# Patient Record
Sex: Male | Born: 1960 | Race: White | Hispanic: No
Health system: Midwestern US, Academic
[De-identification: ages and names within clinical notes are randomized; demographics above are authoritative.]

## PROBLEM LIST (undated history)

## (undated) DIAGNOSIS — F32A Depression, unspecified: Secondary | ICD-10-CM

## (undated) DIAGNOSIS — F419 Anxiety disorder, unspecified: Secondary | ICD-10-CM

## (undated) DIAGNOSIS — F329 Major depressive disorder, single episode, unspecified: Secondary | ICD-10-CM

## (undated) DIAGNOSIS — J449 Chronic obstructive pulmonary disease, unspecified: Secondary | ICD-10-CM

## (undated) DIAGNOSIS — M1990 Unspecified osteoarthritis, unspecified site: Secondary | ICD-10-CM

## (undated) DIAGNOSIS — R569 Unspecified convulsions: Secondary | ICD-10-CM

## (undated) HISTORY — PX: CARDIAC CATHETERIZATION: SHX172

---

## 2012-06-17 ENCOUNTER — Inpatient Hospital Stay
Admit: 2012-06-17 | Discharge: 2012-06-18 | Disposition: A | Discharge status: Home / Self Care | Payer: PRIVATE HEALTH INSURANCE

## 2012-06-17 DIAGNOSIS — R1011 Right upper quadrant pain: Secondary | ICD-10-CM

## 2012-06-17 LAB — CBC
Hematocrit: 42 % (ref 38.5–50.0)
Hemoglobin: 14.1 g/dL (ref 13.2–17.1)
MCH: 30.8 pg (ref 27.0–33.0)
MCHC: 33.6 g/dL (ref 32.0–36.0)
MCV: 91.6 fL (ref 80.0–100.0)
MPV: 8.6 fL (ref 7.5–11.5)
Platelets: 214 10*3/uL (ref 140–400)
RBC: 4.58 10*6/uL (ref 4.20–5.80)
RDW: 13.2 % (ref 11.0–15.0)
WBC: 5.5 10*3/uL (ref 3.8–10.8)

## 2012-06-17 LAB — URINALYSIS W/RFL TO MICROSCOPIC
Bilirubin, UA: NEGATIVE
Blood, UA: NEGATIVE
Glucose, UA: NEGATIVE mg/dL
Ketones, UA: NEGATIVE mg/dL
Leukocytes, UA: NEGATIVE
Nitrite, UA: NEGATIVE
Protein, UA: NEGATIVE mg/dL
Specific Gravity, UA: 1.017 (ref 1.005–1.035)
Urobilinogen, UA: <2 mg/dL (ref 0.2–1.9)
pH, UA: 6 (ref 5.0–8.0)

## 2012-06-17 LAB — HEPATIC FUNCTION PANEL
ALT: 35 U/L (ref 13–69)
AST: 27 U/L (ref 14–65)
Albumin: 4.5 g/dL (ref 3.6–5.1)
Alkaline Phosphatase: 70 U/L (ref 40–115)
Bilirubin, Direct: 0 mg/dL (ref 0.0–0.2)
Total Bilirubin: 0.7 mg/dL (ref 0.2–1.2)
Total Protein: 7.2 g/dL (ref 6.2–8.3)

## 2012-06-17 LAB — DIFFERENTIAL
Basophils Absolute: 28 /uL (ref 0–200)
Basophils Relative: 0.5 % (ref 0.0–1.0)
Eosinophils Absolute: 88 /uL (ref 15–500)
Eosinophils Relative: 1.6 % (ref 0.0–8.0)
Lymphocytes Absolute: 1870 /uL (ref 850–3900)
Lymphocytes Relative: 34 % (ref 15.0–45.0)
Monocytes Absolute: 473 /uL (ref 200–950)
Monocytes Relative: 8.6 % (ref 0.0–12.0)
Neutrophils Absolute: 3042 /uL (ref 1500–7800)
Neutrophils Relative: 55.3 % (ref 40.0–80.0)

## 2012-06-17 LAB — BASIC METABOLIC PANEL
Anion Gap: 11 mmol/L (ref 3–16)
BUN: 18 mg/dL (ref 7–25)
CO2: 27 mmol/L (ref 21–33)
Calcium: 9.3 mg/dL (ref 8.6–10.2)
Chloride: 103 mmol/L (ref 98–110)
Creatinine: 0.94 mg/dL (ref 0.50–1.30)
GFR MDRD Af Amer: 102 See note.
GFR MDRD Non Af Amer: 85 See note.
Glucose: 90 mg/dL (ref 65–99)
Osmolality, Calculated: 293 mOsm/kg (ref 278–305)
Potassium: 3.9 mmol/L (ref 3.5–5.3)
Sodium: 141 mmol/L (ref 135–146)

## 2012-06-17 LAB — LIPASE: Lipase: 150 U/L (ref 23–300)

## 2012-06-17 NOTE — Unmapped (Signed)
[facility] ED Note    Date of Service:  06/17/2012    Reason for Visit: Abdominal Pain      Patient History     HPI: Jimmy Patterson is a 52 y.o. male who presents with intermittent abdominal pain.  The patient describes his pain as being in the right upper quadrant.  It began 24 hours ago.  He initially thought it was gas.  It is primarily in the right upper quadrant.  What was going on he did feel some relief by squeezing around the right rib cage.  He describes it as shooting pains and electrical somewhat like a stitch in the side.  It comes and goes and lasts for one to 2 hours at a time.  He denies any shortness of breath or chest pain or cough.  He's had no lower extremity edema.  He denies any history of recent surgeries recent trauma or history of blood clot of any sort.  He denies any nausea or vomiting.  He has had a good appetite.  By mouth intake is not influenced the pain whatsoever.  He denies any urinary symptoms.  He has never had similar to this in the past.  He does not currently have any pain whatsoever.    History reviewed. No pertinent past medical history.    History reviewed. No pertinent past surgical history.     reports that he has quit smoking. He does not have any smokeless tobacco history on file. He reports that he drinks about 1.8 ounces of alcohol per week. He reports that he does not use illicit drugs.    Previous Medications    No medications on file       Allergies:   Allergies as of 06/17/2012   ??? (No Known Allergies)       Review of Systems      A ten point review of systems was performed and found to be negative with the exception of those things mentioned in the history of present illness.    Physical Exam     ED Triage Vitals   Vital Signs Group      Temp 06/17/12 1700 98.4 ??F (36.9 ??C)      Temp Source 06/17/12 1700 Oral      Heart Rate 06/17/12 1700 58      Heart Rate Source 06/17/12 1700 Monitor      Resp 06/17/12 1700 18      BP 06/17/12 1700 164/106 mmHg      BP Location 06/17/12 1700 Right arm      BP Method 06/17/12 1700 Automatic      Patient Position 06/17/12 1700 Lying   SpO2 06/17/12 1700 58 %   O2 Device 06/17/12 1700 None (Room air)       General: Well appearing.  No acute distress.  HEENT:  Normocephalic, atraumatic.  Moist mucus membranes.  Oropharynx is clear, uvula midline, tonsils are unremarkable.  Neck:  Trachea midline, no JVD, no meningismus   Eyes:  PERRL, EOMI, anicteric sclera  Psych:  Normal affect  MSK:  No long bone deformities, no edema  Pulm:  Clear to auscultation bilaterally.  No wheezes, rales, or rhonchi.  Cardiac:  Regular rate and rhythm; no murmurs, rubs, or gallops. 2+pulses in the upper extremities.  Abdomen: soft, non-tender, no rebound, no guarding, negative Murphy's sign  Neuro:  Awake, alert, and oriented x 3.  Moving all extremities.  GCS 15.    Diagnostic Studies  All laboratory and radiologic examinations performed in the emergency department were reviewed.  Important findings are discussed below.    Labs:   Please see electronic medical record for any tests performed in the ED    Radiology:    Please see electronic medical record for any tests performed in the ED          Emergency Department Procedures         Consultations     None    ED Course and MDM     Diagnosis:   1) abdominal pain    Patient is very well-appearing.  I'm unable to reproduce in his pain whatsoever he states he has no pain at this time.  His vital signs are unremarkable with the exception of mild hypertension and an oxygen saturation documented at 58 which I suspect be an error given the patient has absolutely no respiratory symptoms whatsoever.  Repeat oxygen saturation will be obtained.  Estimated as normal the patient is a PE or see negative.  I do not feel that his right upper quadrant abdominal tenderness represents a pulmonary embolism.  He has no blood in his urine as such I do not think that imaging for stone  he is reasonable.  He has negative Murphy sign no tenderness at this time as such I do not think that imaging for gallbladder would be appropriate or productive.  Gastritis versus duodenitis is certainly a possibility and the patient will be advised treatment as such was given strict precautions and discharged to followup with his primary care physician as needed.      Critical Care Time (Attendings)       Kristine Garbe, MD  06/17/12 2030

## 2012-06-17 NOTE — Unmapped (Signed)
Irritation to the lining of your stomach or small intestine or possible causes of your discomfort.  You have the pain attempt treatment with a medicine like Maalox or Mylanta to see how it helps.  You may also consider beginning a proton pump inhibitor such as Prilosec or Prevacid over-the-counter generic versions are acceptable and equal.  Other considerations for causes of her pain could be an early small kidney stone but we see no evidence of this.  Gallbladder problems are not impossible began unlikely given your examination.  If you develop any new or concerning symptoms please return for reevaluation.    Abdominal Pain  Abdominal pain can be caused by many things. Your caregiver decides the seriousness of your pain by an examination and possibly blood tests and X-rays. Many cases can be observed and treated at home. Most abdominal pain is not caused by a disease and will probably improve without treatment. However, in many cases, more time must pass before a clear cause of the pain can be found. Before that point, it may not be known if you need more testing, or if hospitalization or surgery is needed.  HOME CARE INSTRUCTIONS   ?? Do not take laxatives unless directed by your caregiver.  ?? Take pain medicine only as directed by your caregiver.  ?? Only take over-the-counter or prescription medicines for pain, discomfort, or fever as directed by your caregiver.  ?? Try a clear liquid diet (broth, tea, or water) for as long as directed by your caregiver. Slowly move to a bland diet as tolerated.  SEEK IMMEDIATE MEDICAL CARE IF:   ?? The pain does not go away.  ?? You have a fever.  ?? You keep throwing up (vomiting).  ?? The pain is felt only in portions of the abdomen. Pain in the right side could possibly be appendicitis. In an adult, pain in the left lower portion of the abdomen could be colitis or diverticulitis.  ?? You pass bloody or black tarry stools.  MAKE SURE YOU:   ?? Understand these instructions.  ?? Will  watch your condition.  ?? Will get help right away if you are not doing well or get worse.  Document Released: 10/08/2004 Document Revised: 03/23/2011 Document Reviewed: 08/17/2007  ExitCare?? Patient Information ??2014 [place], [place].

## 2013-01-23 DIAGNOSIS — F4010 Social phobia, unspecified: Secondary | ICD-10-CM | POA: Insufficient documentation

## 2014-03-05 DIAGNOSIS — G8929 Other chronic pain: Secondary | ICD-10-CM

## 2015-06-10 DIAGNOSIS — D7589 Other specified diseases of blood and blood-forming organs: Secondary | ICD-10-CM | POA: Insufficient documentation

## 2015-06-10 DIAGNOSIS — D696 Thrombocytopenia, unspecified: Secondary | ICD-10-CM | POA: Insufficient documentation

## 2015-06-10 DIAGNOSIS — R569 Unspecified convulsions: Secondary | ICD-10-CM | POA: Insufficient documentation

## 2015-06-10 DIAGNOSIS — F1090 Alcohol use, unspecified, uncomplicated: Secondary | ICD-10-CM

## 2015-06-10 DIAGNOSIS — F17200 Nicotine dependence, unspecified, uncomplicated: Secondary | ICD-10-CM

## 2015-06-10 DIAGNOSIS — R748 Abnormal levels of other serum enzymes: Secondary | ICD-10-CM | POA: Insufficient documentation

## 2015-06-10 DIAGNOSIS — F419 Anxiety disorder, unspecified: Secondary | ICD-10-CM | POA: Insufficient documentation

## 2015-06-11 DIAGNOSIS — J431 Panlobular emphysema: Secondary | ICD-10-CM | POA: Insufficient documentation

## 2015-06-11 DIAGNOSIS — E538 Deficiency of other specified B group vitamins: Secondary | ICD-10-CM | POA: Insufficient documentation

## 2015-06-12 DIAGNOSIS — K7030 Alcoholic cirrhosis of liver without ascites: Secondary | ICD-10-CM | POA: Insufficient documentation

## 2015-07-08 DIAGNOSIS — F411 Generalized anxiety disorder: Secondary | ICD-10-CM | POA: Insufficient documentation

## 2015-07-08 DIAGNOSIS — S069XAA Unspecified intracranial injury with loss of consciousness status unknown, initial encounter: Secondary | ICD-10-CM | POA: Insufficient documentation

## 2015-07-08 DIAGNOSIS — G2581 Restless legs syndrome: Secondary | ICD-10-CM | POA: Insufficient documentation

## 2015-07-08 DIAGNOSIS — M5030 Other cervical disc degeneration, unspecified cervical region: Secondary | ICD-10-CM | POA: Insufficient documentation

## 2015-07-08 DIAGNOSIS — G4733 Obstructive sleep apnea (adult) (pediatric): Secondary | ICD-10-CM

## 2015-07-08 DIAGNOSIS — J449 Chronic obstructive pulmonary disease, unspecified: Secondary | ICD-10-CM

## 2015-11-19 ENCOUNTER — Inpatient Hospital Stay
Admit: 2015-11-19 | Discharge: 2015-11-19 | Disposition: A | Discharge status: Home / Self Care | Payer: PRIVATE HEALTH INSURANCE

## 2015-11-19 DIAGNOSIS — S61212A Laceration without foreign body of right middle finger without damage to nail, initial encounter: Secondary | ICD-10-CM

## 2015-11-19 MED ORDER — sodium chloride, irrigation 0.9 % irrigation
0.9 | Status: AC
Start: 2015-11-19 — End: 2015-11-19
  Administered 2015-11-19: 22:00:00

## 2015-11-19 MED ORDER — bacitracin-polymyxin b (POLYSPORIN) ointment (packets)
500-10000 | Freq: Once | TOPICAL | Status: AC
Start: 2015-11-19 — End: 2015-11-19
  Administered 2015-11-19: 21:00:00 1 via TOPICAL

## 2015-11-19 MED ORDER — bacitracin zinc ointment
500 | Freq: Two times a day (BID) | TOPICAL | 0 refills | Status: AC
Start: 2015-11-19 — End: ?

## 2015-11-19 MED ORDER — diphth,pertus(acell),tetanus (BOOSTRIX TDAP) 2.5-8-5 Lf-mcg-Lf/0.5mL syringe Syrg 0.5 mL
2.5-8-5 | Freq: Once | INTRAMUSCULAR | Status: AC
Start: 2015-11-19 — End: 2015-11-19
  Administered 2015-11-19: 21:00:00 0.5 mL via INTRAMUSCULAR

## 2015-11-19 MED ORDER — sodium chloride, irrigation 0.9 % irrigation
0.9 | Status: AC
Start: 2015-11-19 — End: 2015-11-19
  Administered 2015-11-19: 21:00:00 500

## 2015-11-19 MED FILL — SODIUM CHLORIDE 0.9 % IRRIGATION SOLUTION: 0.9 0.9 % | Qty: 500

## 2015-11-19 MED FILL — BOOSTRIX TDAP 2.5 LF UNIT-8 MCG-5 LF/0.5 ML INTRAMUSCULAR SYRINGE: 2.5-8-5 2.5-8-5 Lf-mcg-Lf/0.5mL | INTRAMUSCULAR | Qty: 0.5

## 2015-11-19 MED FILL — BACITRACIN-POLYMYXIN B 500 UNIT-10,000 UNIT/GRAM TOPICAL PACKET: 500-10000 500-10,000 unit/gram | TOPICAL | Qty: 1

## 2015-11-19 MED FILL — SODIUM CHLORIDE 0.9 % IRRIGATION SOLUTION: 0.9 0.9 % | Qty: 1000

## 2015-11-19 NOTE — ED Provider Notes (Signed)
[facility] ED Note    Reason for Visit: Laceration      Patient History     HPI:    Jimmy Patterson is a 55 y.o. male who presents to the ED with lacerations to the R hand. He states that 15 hours prior to arrival, he cut it on a piece of metal. He has lacerations to the 3rd, 4th, and 5th digits. He states that he has been able To move his fingers normally.  He has normal sensation to the fingers.  No fevers or chills.  No purulent drainage.  He states that he is unsure when his last tetanus shot is calm and states he is probably due for a new one.  After he sustained a laceration, he noted that there was a flap of skin, in particular on the 5th digit, so he went to an urgent care.  However they were closing, and he was unable to be seen at the time.  He then put over-the-counter glue as well as over-the-counter coagulant on his hand, to get the bleeding to stop.  At that point he was unable to get medical care because he was going to miss a flight from [facility].  He presents now 15 hours later from the injury.    He did not go to the ED when this happened because he needed to catch a flight home from [facility].      History reviewed. No pertinent past medical history.    Past Surgical History:   Procedure Laterality Date    JOINT REPLACEMENT  03/2015, 2015    BILATERAL HIP REPLACEMENTS       Jimmy Patterson  reports that he has quit smoking. He has never used smokeless tobacco. He reports that he drinks about 1.8 oz of alcohol per week . He reports that he does not use drugs.    Previous Medications    No medications on file       Allergies:   Allergies as of 11/19/2015    (No Known Allergies)       Review of Systems     ROS:    Ten point review of systems was performed. All were negative aside from those described in the HPI.     Physical Exam     ED Triage Vitals [11/19/15 1544]   Vital Signs Group      Temp 98.5 F (36.9 C)      Temp Source Oral      Heart  Rate 78      Heart Rate Source Monitor      Resp 18      SpO2 96 %      BP (!) 131/93      BP Location Left arm      BP Method Automatic      Patient Position Sitting   SpO2 96 %   O2 Device None (Room air)       General:   Well developed, well nourished. In no acute distress. He is very pleasant.   Neck:  Full range of motion. Supple.   Pulmonary:   Clear to auscultation bilaterally. No wheezes, rhonchi, or rales. No increased work of breathing.   Musculoskeletal:  No joint swelling, no joint erythema, no joint edema.  He has lacerations to the palmar surface of the third, fourth, 5th digits. There is a flap of skin cut on the 5th digit. There is no active bleeding. There is dark glue that is  present. There is also dark powder from the coagulation product that he used. On full range of movement, there is no visible tendon on the laceration. He has full strength in the fingers with flexion and extension, as well as sensation. He has mild swelling localized around the laceration. The 5th finger laceration has a small flap associated with it. There is no associated erythema or cellulitis. There is no draingae from the lacerations, and they are hemostatic.Please see electronic medical record for any tests performed in the ED    Vascular:  Strong distal pulses in all four extremities. Cap refill is < 2 seconds.   Skin:  No rashes. Warm and dry.   Neuro:  Alert and oriented X 4. Strength 5/5 in all four extremities. Gait and speech normal.   Psych:  Appropriate mood and affect.      Diagnostic Studies     Labs:    Please see electronic medical record for any tests performed in the ED     Radiology:    Please see electronic medical record for any tests performed in the ED        Emergency Department Procedures         ED Course and MDM     Jimmy Patterson is a 55 y.o. male who presented to the emergency department with Laceration  . VS and nursing notes were reviewed.     Patient presents to the emergency department with  hand lacerations.  There are lacerations on the 3rd 4th and 5th digits.  Given that there 15 hours old, I did not place sutures.  I explained to the patient that because it is 15 hours old, that he runs the risk of increased infection with sutures as opposed to allowing it to heal on its own.  We did soak his hand so we can get all of the coagulant off, as well as the glue.  Once this happened, he had bacitracin and Adaptic placed, as explained that this would need to heal by secondary intention.  His hand is neurovascularly intact, and I explained to him the signs and symptoms of an infection if he were to get one.  I did explain to him that this it is at risk of getting an infection because of the time that went by, although he did wash it out at home.  I instructed him to do dressing changes twice a day, both said that he is cleaning it twice a day and since it is healing by secondary intention, as well as so he can monitor it closely.  He was discharged in good condition, with bacitracin, Adaptic, and Kerlix.    Discharge information:    - Rinse out the wound twice a day  - Then, put bacitracin ointment on it, and then the adaptic. This will keep the kerlix (gauze) from sticking to your wound  - You are at risk of this getting infected. If it starts to get more red, more painful, more swollen, pus is coming out of it, or if you are having fevers / trouble moving your fingers, come back to the emergency department.     Clinical Impression:    Finger lacerations        Kerrie Pleasure, MD  11/19/15 [phone]

## 2015-11-19 NOTE — ED Notes (Signed)
MD at bedside.

## 2015-11-19 NOTE — ED Notes (Signed)
Pt lacerations cleansed after soaking in normal saline. Brown and black areas removed.   Polysporin, non adherent dressing and kerlix applied to pt's 2nd and 3rd fingers. Polysporin and kerlix applied to pt's 4th finger. Tol well.

## 2015-11-19 NOTE — ED Notes (Signed)
Pt wounds noted to have brown and black areas that are rigid and raised.  Pt states that he used a clotting spray that he bought at [facility] last night and he believes that the dark areas are from that spray.   Pt wounds cleansed and irrigated with NS.   Brown area remains on pt's 3rd  Finger.   Dr Thelma Barge notified.   Pt to soak hand for in normal saline and will try to clean dark areas again.

## 2015-11-19 NOTE — ED Notes (Signed)
Pt discharged to home. Pt verbalized understanding of discharge instructions. All questions answered. Ambulated from room with steady gait.

## 2015-11-19 NOTE — ED Triage Notes (Signed)
Pt arrives to ED with c/o lacerations to 3rd, 4th, and 5th fingers on right hand that happened last night. Pt repots he was holding onto a piece of metal and it sliced fingers. Bleeding controlled at this time. Distal CSM intact.

## 2015-11-19 NOTE — Unmapped (Signed)
-   Rinse out the wound twice a day  - Then, put bacitracin ointment on it, and then the adaptic. This will keep the kerlix (gauze) from sticking to your wound  - You are at risk of this getting infected. If it starts to get more red, more painful, more swollen, pus is coming out of it, or if you are having fevers / trouble moving your fingers, come back to the emergency department.

## 2017-07-29 DIAGNOSIS — G2581 Restless legs syndrome: Secondary | ICD-10-CM

## 2017-07-29 DIAGNOSIS — J9601 Acute respiratory failure with hypoxia: Secondary | ICD-10-CM

## 2017-07-29 DIAGNOSIS — R740 Nonspecific elevation of levels of transaminase and lactic acid dehydrogenase [LDH]: Secondary | ICD-10-CM

## 2017-07-29 DIAGNOSIS — S2249XA Multiple fractures of ribs, unspecified side, initial encounter for closed fracture: Secondary | ICD-10-CM

## 2017-07-29 DIAGNOSIS — K760 Fatty (change of) liver, not elsewhere classified: Secondary | ICD-10-CM | POA: Diagnosis not present

## 2017-07-29 DIAGNOSIS — S301XXA Contusion of abdominal wall, initial encounter: Secondary | ICD-10-CM

## 2017-07-29 DIAGNOSIS — S20219A Contusion of unspecified front wall of thorax, initial encounter: Secondary | ICD-10-CM

## 2017-07-30 ENCOUNTER — Inpatient Hospital Stay (HOSPITAL_COMMUNITY)
Admission: AD | Admit: 2017-07-30 | Discharge: 2017-08-09 | DRG: 183 | Disposition: A | Discharge status: Home Health | Payer: Medicare Other | Source: Other Acute Inpatient Hospital | Attending: General Surgery | Admitting: General Surgery

## 2017-07-30 ENCOUNTER — Encounter (HOSPITAL_COMMUNITY): Payer: Self-pay | Admitting: General Surgery

## 2017-07-30 ENCOUNTER — Inpatient Hospital Stay (HOSPITAL_COMMUNITY): Payer: Medicare Other

## 2017-07-30 DIAGNOSIS — J449 Chronic obstructive pulmonary disease, unspecified: Secondary | ICD-10-CM

## 2017-07-30 DIAGNOSIS — J9620 Acute and chronic respiratory failure, unspecified whether with hypoxia or hypercapnia: Secondary | ICD-10-CM

## 2017-07-30 DIAGNOSIS — J9622 Acute and chronic respiratory failure with hypercapnia: Secondary | ICD-10-CM | POA: Diagnosis present

## 2017-07-30 DIAGNOSIS — F329 Major depressive disorder, single episode, unspecified: Secondary | ICD-10-CM | POA: Diagnosis present

## 2017-07-30 DIAGNOSIS — F1090 Alcohol use, unspecified, uncomplicated: Secondary | ICD-10-CM

## 2017-07-30 DIAGNOSIS — M1990 Unspecified osteoarthritis, unspecified site: Secondary | ICD-10-CM | POA: Diagnosis present

## 2017-07-30 DIAGNOSIS — S2239XA Fracture of one rib, unspecified side, initial encounter for closed fracture: Secondary | ICD-10-CM

## 2017-07-30 DIAGNOSIS — E739 Lactose intolerance, unspecified: Secondary | ICD-10-CM | POA: Diagnosis present

## 2017-07-30 DIAGNOSIS — F17200 Nicotine dependence, unspecified, uncomplicated: Secondary | ICD-10-CM

## 2017-07-30 DIAGNOSIS — R0781 Pleurodynia: Secondary | ICD-10-CM | POA: Diagnosis present

## 2017-07-30 DIAGNOSIS — G4733 Obstructive sleep apnea (adult) (pediatric): Secondary | ICD-10-CM

## 2017-07-30 DIAGNOSIS — Y92410 Unspecified street and highway as the place of occurrence of the external cause: Secondary | ICD-10-CM

## 2017-07-30 DIAGNOSIS — Z79899 Other long term (current) drug therapy: Secondary | ICD-10-CM

## 2017-07-30 DIAGNOSIS — R740 Nonspecific elevation of levels of transaminase and lactic acid dehydrogenase [LDH]: Secondary | ICD-10-CM | POA: Diagnosis not present

## 2017-07-30 DIAGNOSIS — S2249XA Multiple fractures of ribs, unspecified side, initial encounter for closed fracture: Secondary | ICD-10-CM

## 2017-07-30 DIAGNOSIS — J9621 Acute and chronic respiratory failure with hypoxia: Secondary | ICD-10-CM | POA: Diagnosis present

## 2017-07-30 DIAGNOSIS — Z720 Tobacco use: Secondary | ICD-10-CM

## 2017-07-30 DIAGNOSIS — F419 Anxiety disorder, unspecified: Secondary | ICD-10-CM | POA: Diagnosis present

## 2017-07-30 DIAGNOSIS — G8929 Other chronic pain: Secondary | ICD-10-CM

## 2017-07-30 DIAGNOSIS — J9690 Respiratory failure, unspecified, unspecified whether with hypoxia or hypercapnia: Secondary | ICD-10-CM

## 2017-07-30 DIAGNOSIS — S2242XA Multiple fractures of ribs, left side, initial encounter for closed fracture: Principal | ICD-10-CM | POA: Diagnosis present

## 2017-07-30 DIAGNOSIS — D72829 Elevated white blood cell count, unspecified: Secondary | ICD-10-CM

## 2017-07-30 DIAGNOSIS — F1010 Alcohol abuse, uncomplicated: Secondary | ICD-10-CM | POA: Diagnosis present

## 2017-07-30 DIAGNOSIS — I10 Essential (primary) hypertension: Secondary | ICD-10-CM | POA: Diagnosis present

## 2017-07-30 DIAGNOSIS — Z9981 Dependence on supplemental oxygen: Secondary | ICD-10-CM

## 2017-07-30 DIAGNOSIS — J9601 Acute respiratory failure with hypoxia: Secondary | ICD-10-CM | POA: Diagnosis not present

## 2017-07-30 DIAGNOSIS — K760 Fatty (change of) liver, not elsewhere classified: Secondary | ICD-10-CM | POA: Diagnosis not present

## 2017-07-30 DIAGNOSIS — S301XXA Contusion of abdominal wall, initial encounter: Secondary | ICD-10-CM | POA: Diagnosis present

## 2017-07-30 DIAGNOSIS — F17210 Nicotine dependence, cigarettes, uncomplicated: Secondary | ICD-10-CM | POA: Diagnosis present

## 2017-07-30 DIAGNOSIS — R52 Pain, unspecified: Secondary | ICD-10-CM

## 2017-07-30 DIAGNOSIS — J9600 Acute respiratory failure, unspecified whether with hypoxia or hypercapnia: Secondary | ICD-10-CM | POA: Diagnosis present

## 2017-07-30 HISTORY — DX: Depression, unspecified: F32.A

## 2017-07-30 HISTORY — DX: Unspecified convulsions: R56.9

## 2017-07-30 HISTORY — DX: Chronic obstructive pulmonary disease, unspecified: J44.9

## 2017-07-30 HISTORY — DX: Anxiety disorder, unspecified: F41.9

## 2017-07-30 HISTORY — DX: Major depressive disorder, single episode, unspecified: F32.9

## 2017-07-30 HISTORY — DX: Unspecified osteoarthritis, unspecified site: M19.90

## 2017-07-30 LAB — CBC
HCT: 46.2 % (ref 39.0–52.0)
HEMOGLOBIN: 15.4 g/dL (ref 13.0–17.0)
MCH: 36 pg — ABNORMAL HIGH (ref 26.0–34.0)
MCHC: 33.3 g/dL (ref 30.0–36.0)
MCV: 107.9 fL — AB (ref 78.0–100.0)
Platelets: 120 10*3/uL — ABNORMAL LOW (ref 150–400)
RBC: 4.28 MIL/uL (ref 4.22–5.81)
RDW: 14.5 % (ref 11.5–15.5)
WBC: 11.1 10*3/uL — AB (ref 4.0–10.5)

## 2017-07-30 LAB — BLOOD GAS, ARTERIAL
ACID-BASE EXCESS: 3.1 mmol/L — AB (ref 0.0–2.0)
Bicarbonate: 26.4 mmol/L (ref 20.0–28.0)
DRAWN BY: 442211
FIO2: 50
O2 SAT: 92.9 %
PATIENT TEMPERATURE: 98.6
pCO2 arterial: 35.2 mmHg (ref 32.0–48.0)
pH, Arterial: 7.487 — ABNORMAL HIGH (ref 7.350–7.450)
pO2, Arterial: 65.5 mmHg — ABNORMAL LOW (ref 83.0–108.0)

## 2017-07-30 LAB — CREATININE, SERUM: Creatinine, Ser: 0.86 mg/dL (ref 0.61–1.24)

## 2017-07-30 LAB — MRSA PCR SCREENING: MRSA by PCR: NEGATIVE

## 2017-07-30 MED ORDER — ONDANSETRON HCL 4 MG/2ML IJ SOLN
4.0000 mg | Freq: Four times a day (QID) | INTRAMUSCULAR | Status: DC | PRN
  Administered 2017-07-31: 4 mg via INTRAVENOUS
  Filled 2017-07-30: qty 2

## 2017-07-30 MED ORDER — IPRATROPIUM-ALBUTEROL 0.5-2.5 (3) MG/3ML IN SOLN
3.0000 mL | Freq: Four times a day (QID) | RESPIRATORY_TRACT | Status: DC
  Administered 2017-07-30: 3 mL via RESPIRATORY_TRACT
  Filled 2017-07-30: qty 3

## 2017-07-30 MED ORDER — HYDROMORPHONE HCL 1 MG/ML IJ SOLN
1.0000 mg | INTRAMUSCULAR | Status: DC | PRN
  Administered 2017-07-30 – 2017-08-03 (×7): 1 mg via INTRAVENOUS
  Filled 2017-07-30 (×7): qty 1

## 2017-07-30 MED ORDER — LORAZEPAM 2 MG/ML IJ SOLN
1.0000 mg | INTRAMUSCULAR | Status: DC | PRN
  Administered 2017-07-30 – 2017-07-31 (×3): 2 mg via INTRAVENOUS
  Administered 2017-07-31 (×2): 1 mg via INTRAVENOUS
  Filled 2017-07-30 (×5): qty 1

## 2017-07-30 MED ORDER — ONDANSETRON 4 MG PO TBDP: 4.0000 mg | ORAL_TABLET | Freq: Four times a day (QID) | ORAL | Status: DC | PRN

## 2017-07-30 MED ORDER — OXYCODONE HCL 5 MG PO TABS
5.0000 mg | ORAL_TABLET | ORAL | Status: DC | PRN
  Administered 2017-07-31 – 2017-08-08 (×4): 5 mg via ORAL
  Filled 2017-07-30 (×6): qty 1

## 2017-07-30 MED ORDER — HYDRALAZINE HCL 20 MG/ML IJ SOLN: 10.0000 mg | INTRAMUSCULAR | Status: DC | PRN

## 2017-07-30 MED ORDER — TRAMADOL HCL 50 MG PO TABS: 50.0000 mg | ORAL_TABLET | Freq: Four times a day (QID) | ORAL | Status: DC | PRN

## 2017-07-30 MED ORDER — KCL IN DEXTROSE-NACL 20-5-0.45 MEQ/L-%-% IV SOLN
INTRAVENOUS | Status: DC
  Administered 2017-07-30: 21:00:00 via INTRAVENOUS
  Administered 2017-07-31: 1 mL via INTRAVENOUS
  Administered 2017-08-01: 15:00:00 via INTRAVENOUS
  Filled 2017-07-30 (×5): qty 1000

## 2017-07-30 MED ORDER — METHOCARBAMOL 1000 MG/10ML IJ SOLN
1000.0000 mg | Freq: Three times a day (TID) | INTRAMUSCULAR | Status: DC | PRN
  Administered 2017-08-01 – 2017-08-02 (×3): 1000 mg via INTRAVENOUS
  Filled 2017-07-30 (×4): qty 10

## 2017-07-30 MED ORDER — ACETAMINOPHEN 325 MG PO TABS
650.0000 mg | ORAL_TABLET | Freq: Four times a day (QID) | ORAL | Status: DC
  Administered 2017-07-30 – 2017-07-31 (×3): 650 mg via ORAL
  Filled 2017-07-30 (×4): qty 2

## 2017-07-30 MED ORDER — OXYCODONE HCL 5 MG PO TABS
10.0000 mg | ORAL_TABLET | ORAL | Status: DC | PRN
  Administered 2017-07-30 – 2017-08-09 (×30): 10 mg via ORAL
  Filled 2017-07-30 (×30): qty 2

## 2017-07-30 MED ORDER — ENOXAPARIN SODIUM 40 MG/0.4ML ~~LOC~~ SOLN
40.0000 mg | SUBCUTANEOUS | Status: DC
  Administered 2017-07-31 – 2017-08-09 (×10): 40 mg via SUBCUTANEOUS
  Filled 2017-07-30 (×10): qty 0.4
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
## 2017-07-31 LAB — BASIC METABOLIC PANEL
ANION GAP: 8 (ref 5–15)
BUN: 16 mg/dL (ref 6–20)
CALCIUM: 8.4 mg/dL — AB (ref 8.9–10.3)
CO2: 26 mmol/L (ref 22–32)
Chloride: 102 mmol/L (ref 98–111)
Creatinine, Ser: 0.78 mg/dL (ref 0.61–1.24)
Glucose, Bld: 117 mg/dL — ABNORMAL HIGH (ref 70–99)
Potassium: 4 mmol/L (ref 3.5–5.1)
SODIUM: 136 mmol/L (ref 135–145)

## 2017-07-31 LAB — CBC
HEMATOCRIT: 42.3 % (ref 39.0–52.0)
Hemoglobin: 13.8 g/dL (ref 13.0–17.0)
MCH: 35.3 pg — ABNORMAL HIGH (ref 26.0–34.0)
MCHC: 32.6 g/dL (ref 30.0–36.0)
MCV: 108.2 fL — ABNORMAL HIGH (ref 78.0–100.0)
PLATELETS: 97 10*3/uL — AB (ref 150–400)
RBC: 3.91 MIL/uL — ABNORMAL LOW (ref 4.22–5.81)
RDW: 14.5 % (ref 11.5–15.5)
WBC: 11.4 10*3/uL — AB (ref 4.0–10.5)

## 2017-07-31 LAB — HIV ANTIBODY (ROUTINE TESTING W REFLEX): HIV Screen 4th Generation wRfx: NONREACTIVE

## 2017-07-31 MED ORDER — NICOTINE 21 MG/24HR TD PT24
21.0000 mg | MEDICATED_PATCH | Freq: Every day | TRANSDERMAL | Status: DC
  Administered 2017-07-31 – 2017-08-09 (×10): 21 mg via TRANSDERMAL
  Filled 2017-07-31 (×10): qty 1

## 2017-07-31 MED ORDER — SALINE SPRAY 0.65 % NA SOLN
1.0000 | NASAL | Status: DC | PRN
  Filled 2017-07-31: qty 44

## 2017-07-31 MED ORDER — DEXMEDETOMIDINE HCL IN NACL 400 MCG/100ML IV SOLN
0.2000 ug/kg/h | INTRAVENOUS | Status: DC
  Administered 2017-08-01: 0.7 ug/kg/h via INTRAVENOUS
  Administered 2017-08-01: 0.2 ug/kg/h via INTRAVENOUS
  Administered 2017-08-01 – 2017-08-02 (×3): 0.7 ug/kg/h via INTRAVENOUS
  Filled 2017-07-31 (×6): qty 100

## 2017-07-31 MED ORDER — ALUM & MAG HYDROXIDE-SIMETH 200-200-20 MG/5ML PO SUSP
30.0000 mL | Freq: Four times a day (QID) | ORAL | Status: DC | PRN
  Administered 2017-07-31 – 2017-08-09 (×8): 30 mL via ORAL
  Filled 2017-07-31 (×8): qty 30

## 2017-07-31 MED ORDER — IPRATROPIUM-ALBUTEROL 0.5-2.5 (3) MG/3ML IN SOLN
3.0000 mL | Freq: Four times a day (QID) | RESPIRATORY_TRACT | Status: DC | PRN
  Administered 2017-07-31: 3 mL via RESPIRATORY_TRACT
  Filled 2017-07-31: qty 3

## 2017-07-31 MED ORDER — IPRATROPIUM-ALBUTEROL 0.5-2.5 (3) MG/3ML IN SOLN
3.0000 mL | Freq: Three times a day (TID) | RESPIRATORY_TRACT | Status: DC
  Administered 2017-07-31 – 2017-08-08 (×27): 3 mL via RESPIRATORY_TRACT
  Filled 2017-07-31 (×28): qty 3
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
## 2017-07-31 NOTE — Progress Notes (Signed)
PT Cancellation Note  Patient Details Name: Brad Allen MRN: [id number] DOB: 04/06/1960   Cancelled Treatment:    Reason Eval/Treat Not Completed: Patient not medically ready, pt with fever and tenuous respiratory status, will check back tomorrow.    [name] 07/31/2017, 2:11 PM

## 2017-07-31 NOTE — Progress Notes (Signed)
Pt tolerated venti-mask 50% throughout the night.  Awoke at 5am complaining of 10/10 left rib pain and SOB. Given oxy IR 10mg  and ativan 1mg  for anxiety. Duoneb given.  Pt awake and desats to 87% consistently. Able to perform 1250cc on incentive spirometer. RT called and placed BIPAP. Currently tolerating BIPAP well and sats 92%.

## 2017-08-01 LAB — EXPECTORATED SPUTUM ASSESSMENT W GRAM STAIN, RFLX TO RESP C: Special Requests: NORMAL

## 2017-08-01 LAB — BLOOD GAS, ARTERIAL
Acid-Base Excess: 0.4 mmol/L (ref 0.0–2.0)
BICARBONATE: 24.3 mmol/L (ref 20.0–28.0)
DRAWN BY: 313941
O2 Content: 9 L/min
O2 Saturation: 86.5 %
PH ART: 7.413 (ref 7.350–7.450)
Patient temperature: 99.4
pCO2 arterial: 39.1 mmHg (ref 32.0–48.0)
pO2, Arterial: 55.9 mmHg — ABNORMAL LOW (ref 83.0–108.0)

## 2017-08-01 LAB — EXPECTORATED SPUTUM ASSESSMENT W REFEX TO RESP CULTURE

## 2017-08-01 MED ORDER — SODIUM CHLORIDE 0.9 % IV SOLN
INTRAVENOUS | Status: DC
  Administered 2017-08-01: 10:00:00 via INTRAVENOUS

## 2017-08-01 MED ORDER — LORAZEPAM 2 MG/ML IJ SOLN
2.0000 mg | INTRAMUSCULAR | Status: DC | PRN
  Administered 2017-08-01 – 2017-08-02 (×5): 2 mg via INTRAVENOUS
  Filled 2017-08-01 (×5): qty 1

## 2017-08-01 MED ORDER — ALBUMIN HUMAN 25 % IV SOLN
12.5000 g | Freq: Once | INTRAVENOUS | Status: AC
  Administered 2017-08-01: 12.5 g via INTRAVENOUS
  Filled 2017-08-01: qty 50

## 2017-08-01 MED ORDER — FUROSEMIDE 10 MG/ML IJ SOLN
40.0000 mg | Freq: Once | INTRAMUSCULAR | Status: AC
  Administered 2017-08-01: 40 mg via INTRAVENOUS
  Filled 2017-08-01: qty 4

## 2017-08-01 NOTE — Progress Notes (Signed)
PT Cancellation Note  Patient Details Name: Brad Allen MRN: [id number] DOB: 10/28/1960   Cancelled Treatment:    Reason Eval/Treat Not Completed: Patient not medically ready   Noted Respiratory status continues to be tenuous;  Will hold PT eval today, and continue to follow for appropriateness of PT/mobility;   Brad Allen, PT  Acute Rehabilitation Services Pager [phone] Office [phone]    Brad Allen 08/01/2017, 9:37 AM

## 2017-08-01 NOTE — Plan of Care (Signed)
Patient still on clear liquid diet because of nausea related to anxiety.  Have added PRN to med list along with IV precedex to decrease anxiety.  Pain fairly well controlled with IV dilaudid and PO oxy. Brad Allen

## 2017-08-01 NOTE — Progress Notes (Signed)
Paged MD due to patient anxiety unrelieved by ativan. 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 Received orders for blood gas and precedex drip.
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
## 2017-08-02 ENCOUNTER — Inpatient Hospital Stay (HOSPITAL_COMMUNITY): Payer: Medicare Other

## 2017-08-02 LAB — CULTURE, RESPIRATORY
CULTURE: NORMAL
SPECIAL REQUESTS: NORMAL

## 2017-08-02 LAB — HEPATIC FUNCTION PANEL
ALBUMIN: 3.1 g/dL — AB (ref 3.5–5.0)
ALT: 47 U/L — AB (ref 0–44)
AST: 55 U/L — ABNORMAL HIGH (ref 15–41)
Alkaline Phosphatase: 84 U/L (ref 38–126)
BILIRUBIN TOTAL: 2.5 mg/dL — AB (ref 0.3–1.2)
Bilirubin, Direct: 1 mg/dL — ABNORMAL HIGH (ref 0.0–0.2)
Indirect Bilirubin: 1.5 mg/dL — ABNORMAL HIGH (ref 0.3–0.9)
Total Protein: 7 g/dL (ref 6.5–8.1)

## 2017-08-02 LAB — BASIC METABOLIC PANEL
ANION GAP: 11 (ref 5–15)
BUN: 13 mg/dL (ref 6–20)
CO2: 27 mmol/L (ref 22–32)
Calcium: 8.6 mg/dL — ABNORMAL LOW (ref 8.9–10.3)
Chloride: 100 mmol/L (ref 98–111)
Creatinine, Ser: 0.91 mg/dL (ref 0.61–1.24)
GFR calc Af Amer: >60 mL/min (ref >60)
Glucose, Bld: 121 mg/dL — ABNORMAL HIGH (ref 70–99)
POTASSIUM: 3.8 mmol/L (ref 3.5–5.1)
SODIUM: 138 mmol/L (ref 135–145)

## 2017-08-02 LAB — BLOOD GAS, ARTERIAL
ACID-BASE EXCESS: 1.8 mmol/L (ref 0.0–2.0)
Bicarbonate: 25.7 mmol/L (ref 20.0–28.0)
Drawn by: 31394
O2 Content: 10 L/min
O2 Saturation: 90.4 %
PH ART: 7.432 (ref 7.350–7.450)
Patient temperature: 98.3
pCO2 arterial: 39.2 mmHg (ref 32.0–48.0)
pO2, Arterial: 59.4 mmHg — ABNORMAL LOW (ref 83.0–108.0)

## 2017-08-02 LAB — CBC
HCT: 43.1 % (ref 39.0–52.0)
Hemoglobin: 14.3 g/dL (ref 13.0–17.0)
MCH: 35.9 pg — AB (ref 26.0–34.0)
MCHC: 33.2 g/dL (ref 30.0–36.0)
MCV: 108.3 fL — ABNORMAL HIGH (ref 78.0–100.0)
PLATELETS: 135 10*3/uL — AB (ref 150–400)
RBC: 3.98 MIL/uL — AB (ref 4.22–5.81)
RDW: 13.7 % (ref 11.5–15.5)
WBC: 15 10*3/uL — ABNORMAL HIGH (ref 4.0–10.5)

## 2017-08-02 LAB — CULTURE, RESPIRATORY W GRAM STAIN

## 2017-08-02 MED ORDER — VITAMIN B-1 100 MG PO TABS
100.0000 mg | ORAL_TABLET | Freq: Every day | ORAL | Status: DC
  Administered 2017-08-02 – 2017-08-09 (×8): 100 mg via ORAL
  Filled 2017-08-02 (×8): qty 1

## 2017-08-02 MED ORDER — GABAPENTIN 600 MG PO TABS
600.0000 mg | ORAL_TABLET | Freq: Three times a day (TID) | ORAL | Status: DC
  Administered 2017-08-02 (×3): 600 mg via ORAL
  Filled 2017-08-02 (×5): qty 1

## 2017-08-02 MED ORDER — ROPINIROLE HCL 1 MG PO TABS
0.5000 mg | ORAL_TABLET | Freq: Every day | ORAL | Status: DC
  Administered 2017-08-02 – 2017-08-08 (×7): 0.5 mg via ORAL
  Filled 2017-08-02 (×7): qty 1

## 2017-08-02 MED ORDER — BISACODYL 10 MG RE SUPP
10.0000 mg | Freq: Once | RECTAL | Status: AC
  Administered 2017-08-02: 10 mg via RECTAL
  Filled 2017-08-02: qty 1

## 2017-08-02 MED ORDER — FUROSEMIDE 10 MG/ML IJ SOLN
40.0000 mg | Freq: Once | INTRAMUSCULAR | Status: AC
  Administered 2017-08-02: 40 mg via INTRAVENOUS
  Filled 2017-08-02: qty 4

## 2017-08-02 MED ORDER — KETOROLAC TROMETHAMINE 30 MG/ML IJ SOLN
30.0000 mg | Freq: Once | INTRAMUSCULAR | Status: AC
  Administered 2017-08-02: 30 mg via INTRAVENOUS
  Filled 2017-08-02: qty 1

## 2017-08-02 MED ORDER — SPIRONOLACTONE 25 MG PO TABS
25.0000 mg | ORAL_TABLET | Freq: Every day | ORAL | Status: DC
  Administered 2017-08-02 – 2017-08-09 (×8): 25 mg via ORAL
  Filled 2017-08-02 (×9): qty 1

## 2017-08-02 MED ORDER — KETOROLAC TROMETHAMINE 15 MG/ML IJ SOLN
15.0000 mg | Freq: Four times a day (QID) | INTRAMUSCULAR | Status: DC
  Administered 2017-08-02 – 2017-08-03 (×6): 15 mg via INTRAVENOUS
  Filled 2017-08-02 (×6): qty 1

## 2017-08-02 MED ORDER — ALPRAZOLAM 0.5 MG PO TABS
1.0000 mg | ORAL_TABLET | Freq: Four times a day (QID) | ORAL | Status: DC
  Administered 2017-08-02 – 2017-08-09 (×28): 1 mg via ORAL
  Filled 2017-08-02 (×15): qty 2
  Filled 2017-08-02: qty 4
  Filled 2017-08-02 (×14): qty 2

## 2017-08-02 NOTE — Progress Notes (Addendum)
Rehab Admissions Coordinator Note:  Patient was screened by Clois DupesBoyette, [place] Jaquith Godwin for appropriateness for an Inpatient Acute Rehab Consult per PT and OT recommendation.   At this time, we are recommending Inpatient Rehab consult. Please place order for consult if pt would like to be considered for admit. Please advise.   Clois DupesBoyette, Lunabella Badgett Godwin 08/02/2017, 3:06 PM  I can be reached at [phone].
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
## 2017-08-02 NOTE — Plan of Care (Signed)
Patient continues on high flow nasal cannula.

## 2017-08-02 NOTE — Progress Notes (Signed)
Trauma Service Note  Subjective: Patient talks, but mumbles a lot.  No acute distress.  Seems to be breathing more easily.  Oxygen saturations.are 91-95% when he takes a deep breath.  Able to get IS up to 1250   Objective: Vital signs in last 24 hours: Temp:  [98.1 F (36.7 C)-99.5 F (37.5 C)] 98.1 F (36.7 C) (07/22 0400) Pulse Rate:  [68-83] 73 (07/22 0802) Resp:  [14-25] 19 (07/22 0802) BP: (75-129)/(46-83) 104/78 (07/22 0802) SpO2:  [87 %-96 %] 93 % (07/22 0836) FiO2 (%):  [60 %] 60 % (07/21 1209) Last BM Date: (PTA)  Intake/Output from previous day: 07/21 0701 - 07/22 0700 In: 1925.9 [I.V.:1755.8; IV Piggyback:170.1] Out: 1275 [Urine:1275] Intake/Output this shift: Total I/O In: 77.2 [I.V.:77.2] Out: -   General: No acute distress  Lungs: Diminished breath sounds in the bases.  Abd: Soft, not tender  Extremities: No changes.    Neuro: Seems to be intact..  Slurs a lot, but seems to be oriented.  Lab Results: CBC  Recent Labs    07/31/17 0246 08/02/17 0247  WBC 11.4* 15.0*  HGB 13.8 14.3  HCT 42.3 43.1  PLT 97* 135*   BMET Recent Labs    07/31/17 0246 08/02/17 0247  NA 136 138  K 4.0 3.8  CL 102 100  CO2 26 27  GLUCOSE 117* 121*  BUN 16 13  CREATININE 0.78 0.91  CALCIUM 8.4* 8.6*   PT/INR No results for input(s): LABPROT, INR in the last 72 hours. ABG Recent Labs    08/01/17 0000 08/02/17 0345  PHART 7.413 7.432  HCO3 24.3 25.7    Studies/Results: Dg Chest Port 1 View  Result Date: 08/02/2017 CLINICAL DATA:  Respiratory failure, history of left rib fractures, COPD. EXAM: PORTABLE CHEST 1 VIEW COMPARISON:  Portable chest x-ray of July 30, 2017 FINDINGS: The lungs are reasonably well inflated. Increased interstitial density is present at both bases with obscuration of the hemidiaphragms. The cardiac silhouette is mildly enlarged. The pulmonary vascularity is not engorged. There is calcification in the wall of the aortic arch. The bony  thorax exhibits no acute abnormality. IMPRESSION: Bibasilar atelectasis or pneumonia worse since the earlier study. No pneumothorax. Underlying COPD. No CHF. Thoracic aortic atherosclerosis. Electronically Signed   By: David  [place]azil[place] M.D.   On: 08/02/2017 07:44    Anti-infectives: Anti-infectives (From admission, onward)   None      Assessment/Plan: s/p  Advance diet Decrease IVFs  PT/OT   LOS: 3 days   Marta LamasJames O. Gae BonWyatt, III, MD, FACS [phone] Trauma Surgeon 08/02/2017

## 2017-08-03 DIAGNOSIS — F1010 Alcohol abuse, uncomplicated: Secondary | ICD-10-CM

## 2017-08-03 DIAGNOSIS — Z9981 Dependence on supplemental oxygen: Secondary | ICD-10-CM

## 2017-08-03 DIAGNOSIS — Z720 Tobacco use: Secondary | ICD-10-CM

## 2017-08-03 DIAGNOSIS — J449 Chronic obstructive pulmonary disease, unspecified: Secondary | ICD-10-CM

## 2017-08-03 DIAGNOSIS — R52 Pain, unspecified: Secondary | ICD-10-CM

## 2017-08-03 DIAGNOSIS — G4733 Obstructive sleep apnea (adult) (pediatric): Secondary | ICD-10-CM

## 2017-08-03 DIAGNOSIS — D72829 Elevated white blood cell count, unspecified: Secondary | ICD-10-CM

## 2017-08-03 DIAGNOSIS — J9620 Acute and chronic respiratory failure, unspecified whether with hypoxia or hypercapnia: Secondary | ICD-10-CM

## 2017-08-03 MED ORDER — POLYETHYLENE GLYCOL 3350 17 G PO PACK
17.0000 g | PACK | Freq: Every day | ORAL | Status: DC
  Administered 2017-08-03 – 2017-08-09 (×7): 17 g via ORAL
  Filled 2017-08-03 (×7): qty 1

## 2017-08-03 MED ORDER — POTASSIUM CHLORIDE CRYS ER 20 MEQ PO TBCR
20.0000 meq | EXTENDED_RELEASE_TABLET | Freq: Two times a day (BID) | ORAL | Status: AC
  Administered 2017-08-03 (×2): 20 meq via ORAL
  Filled 2017-08-03 (×2): qty 1

## 2017-08-03 MED ORDER — FUROSEMIDE 10 MG/ML IJ SOLN
40.0000 mg | Freq: Once | INTRAMUSCULAR | Status: AC
  Administered 2017-08-03: 40 mg via INTRAVENOUS
  Filled 2017-08-03: qty 4

## 2017-08-03 MED ORDER — GABAPENTIN 300 MG PO CAPS
600.0000 mg | ORAL_CAPSULE | Freq: Three times a day (TID) | ORAL | Status: DC
  Administered 2017-08-03 – 2017-08-09 (×19): 600 mg via ORAL
  Filled 2017-08-03 (×19): qty 2
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
## 2017-08-04 ENCOUNTER — Inpatient Hospital Stay (HOSPITAL_COMMUNITY): Payer: Medicare Other

## 2017-08-04 LAB — BASIC METABOLIC PANEL
Anion gap: 8 (ref 5–15)
BUN: 15 mg/dL (ref 6–20)
CHLORIDE: 98 mmol/L (ref 98–111)
CO2: 32 mmol/L (ref 22–32)
CREATININE: 0.75 mg/dL (ref 0.61–1.24)
Calcium: 8.6 mg/dL — ABNORMAL LOW (ref 8.9–10.3)
GFR calc Af Amer: >60 mL/min (ref >60)
GFR calc non Af Amer: >60 mL/min (ref >60)
GLUCOSE: 101 mg/dL — AB (ref 70–99)
Potassium: 3.3 mmol/L — ABNORMAL LOW (ref 3.5–5.1)
SODIUM: 138 mmol/L (ref 135–145)

## 2017-08-04 MED ORDER — TRAMADOL HCL 50 MG PO TABS
50.0000 mg | ORAL_TABLET | Freq: Four times a day (QID) | ORAL | Status: DC
  Administered 2017-08-04 – 2017-08-09 (×18): 50 mg via ORAL
  Filled 2017-08-04 (×19): qty 1

## 2017-08-04 MED ORDER — HYDROMORPHONE HCL 1 MG/ML IJ SOLN: 1.0000 mg | INTRAMUSCULAR | Status: DC | PRN

## 2017-08-04 MED ORDER — BISACODYL 10 MG RE SUPP: 10.0000 mg | Freq: Every day | RECTAL | Status: DC | PRN

## 2017-08-04 MED ORDER — FLEET ENEMA 7-19 GM/118ML RE ENEM: 1.0000 | ENEMA | Freq: Every day | RECTAL | Status: DC | PRN

## 2017-08-04 MED ORDER — METHOCARBAMOL 500 MG PO TABS
1000.0000 mg | ORAL_TABLET | Freq: Three times a day (TID) | ORAL | Status: DC
  Administered 2017-08-04 – 2017-08-09 (×16): 1000 mg via ORAL
  Filled 2017-08-04 (×16): qty 2
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
## 2017-08-04 NOTE — Progress Notes (Signed)
Trauma Service Note  Subjective: Patient is doing better, but still has marginal oxygen saturations.  No distress and he does not feel short of breath  Objective: Vital signs in last 24 hours: Temp:  [97.5 F (36.4 C)-98.8 F (37.1 C)] 98.8 F (37.1 C) (07/24 0400) Pulse Rate:  [71-94] 84 (07/24 0800) Resp:  [10-19] 14 (07/24 0800) BP: (94-142)/(65-98) 132/82 (07/24 0800) SpO2:  [87 %-96 %] 91 % (07/24 0810) Last BM Date: (PTA)  Intake/Output from previous day: 07/23 0701 - 07/24 0700 In: 240 [P.O.:240] Out: 2000 [Urine:2000] Intake/Output this shift: No intake/output data recorded.  General: No acute distress  Lungs: Clear to auscultation.    Abd: Distended with good bowel sounds.  Patient feels bloated.  Will minimize narcotics.  Extremities: No clinical signs or symptoms of DVT  Neuro: Intact  Lab Results: CBC  Recent Labs    08/02/17 0247  WBC 15.0*  HGB 14.3  HCT 43.1  PLT 135*   BMET Recent Labs    08/02/17 0247 08/04/17 0242  NA 138 138  K 3.8 3.3*  CL 100 98  CO2 27 32  GLUCOSE 121* 101*  BUN 13 15  CREATININE 0.91 0.75  CALCIUM 8.6* 8.6*   PT/INR No results for input(s): LABPROT, INR in the last 72 hours. ABG Recent Labs    08/02/17 0345  PHART 7.432  HCO3 25.7    Studies/Results: Dg Chest Port 1 View  Result Date: 08/04/2017 CLINICAL DATA:  Respiratory failure.  Left rib fractures.  COPD EXAM: PORTABLE CHEST 1 VIEW COMPARISON:  08/02/2017 FINDINGS: Bibasilar atelectasis or infiltrates are similar prior study, possibly slightly improved in the right base. No visible significant effusions. No pneumothorax. Heart is borderline in size. IMPRESSION: Bibasilar atelectasis or infiltrates, stable on the left and slightly improved on the right. Electronically Signed   By: Charlett NoseKevin  Dover M.D.   On: 08/04/2017 07:16    Anti-infectives: Anti-infectives (From admission, onward)   None      Assessment/Plan: s/p  Bowel regimen  Transfer to  4NP SDU  LOS: 5 days   Marta LamasJames O. Gae BonWyatt, III, MD, FACS [phone] Trauma Surgeon 08/04/2017

## 2017-08-04 NOTE — Progress Notes (Signed)
Inpatient Rehabilitation  Met with patient at bedside to discuss team's recommendation for IP Rehab and Rehab MD's recommendations.  Note that earlier today patient ambulated 550 feet Min guard.  Patient reports having the support he needs from his spouse at time of discharge.  Recommend home with home health when medically stable.  Will sign off at this time.  Notified nurse case Freight forwarder.  Call if questions.    Carmelia Roller., CCC/SLP Admission Coordinator  [facility]  Cell [phone]

## 2017-08-04 NOTE — Progress Notes (Addendum)
Patient transferred to 4N 13 via recliner chair with cardiac monitor and O2 at 2L [place].

## 2017-08-05 LAB — BASIC METABOLIC PANEL
Anion gap: 9 (ref 5–15)
BUN: 7 mg/dL (ref 6–20)
CO2: 31 mmol/L (ref 22–32)
CREATININE: 0.71 mg/dL (ref 0.61–1.24)
Calcium: 8.6 mg/dL — ABNORMAL LOW (ref 8.9–10.3)
Chloride: 99 mmol/L (ref 98–111)
GFR calc Af Amer: >60 mL/min (ref >60)
Glucose, Bld: 106 mg/dL — ABNORMAL HIGH (ref 70–99)
Potassium: 3.3 mmol/L — ABNORMAL LOW (ref 3.5–5.1)
SODIUM: 139 mmol/L (ref 135–145)

## 2017-08-05 LAB — CBC WITH DIFFERENTIAL/PLATELET
Abs Immature Granulocytes: 0.1 10*3/uL (ref 0.0–0.1)
Basophils Absolute: 0.1 10*3/uL (ref 0.0–0.1)
Basophils Relative: 1 %
EOS ABS: 0.5 10*3/uL (ref 0.0–0.7)
EOS PCT: 6 %
HCT: 40 % (ref 39.0–52.0)
Hemoglobin: 13.3 g/dL (ref 13.0–17.0)
Immature Granulocytes: 1 %
Lymphocytes Relative: 24 %
Lymphs Abs: 2 10*3/uL (ref 0.7–4.0)
MCH: 35.5 pg — AB (ref 26.0–34.0)
MCHC: 33.3 g/dL (ref 30.0–36.0)
MCV: 106.7 fL — ABNORMAL HIGH (ref 78.0–100.0)
MONOS PCT: 17 %
Monocytes Absolute: 1.4 10*3/uL — ABNORMAL HIGH (ref 0.1–1.0)
Neutro Abs: 4.3 10*3/uL (ref 1.7–7.7)
Neutrophils Relative %: 51 %
Platelets: 146 10*3/uL — ABNORMAL LOW (ref 150–400)
RBC: 3.75 MIL/uL — ABNORMAL LOW (ref 4.22–5.81)
RDW: 13.5 % (ref 11.5–15.5)
WBC: 8.3 10*3/uL (ref 4.0–10.5)

## 2017-08-05 NOTE — Plan of Care (Signed)
  Problem: Clinical Measurements: Goal: Respiratory complications will improve Outcome: Progressing Goal: Cardiovascular complication will be avoided Outcome: Progressing   Problem: Activity: Goal: Risk for activity intolerance will decrease Outcome: Progressing   Problem: Nutrition: Goal: Adequate nutrition will be maintained Outcome: Progressing   Problem: Coping: Goal: Level of anxiety will decrease Outcome: Progressing   Problem: Elimination: Goal: Will not experience complications related to bowel motility Outcome: Progressing   Problem: Pain Managment: Goal: General experience of comfort will improve Outcome: Progressing   
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
## 2017-08-05 NOTE — Care Management Important Message (Signed)
Important Message  Patient Details  Name: Brad Allen MRN: [id number] Date of Birth: 03/05/1960   Medicare Important Message Given:  Yes    Oma Alpert 08/05/2017, 1:45 PM

## 2017-08-06 LAB — CREATININE, SERUM
CREATININE: 0.73 mg/dL (ref 0.61–1.24)
GFR calc Af Amer: >60 mL/min (ref >60)
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
## 2017-08-06 NOTE — Care Management (Signed)
SATURATION QUALIFICATIONS: (This note is used to comply with regulatory documentation for home oxygen)  Patient Saturations on Room Air at Rest = 86%  3L O2 reapplied; O2 sat returned to 91%.  Upped to 4L/[place]; O2 sat 95%.    Quintella BatonJulie W. Pepper Wyndham, RN, BSN  Trauma/Neuro ICU Case Manager [phone]
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
## 2017-08-07 ENCOUNTER — Inpatient Hospital Stay (HOSPITAL_COMMUNITY): Payer: Medicare Other

## 2017-08-07 NOTE — Progress Notes (Signed)
1900: Handoff report received from RN. Pt resting with wife at bedside. Discussed plan of care for the shift; pt amenable to plan. O2 sats in the low 80s despite HFNC@5L . Titrated up to 10L, then 14L without change. NRB applied @14L ; pt able to get to 90% with cough and deep breathe. RT called; pulse ox site changed to forehead, and O2 sats @ 100%. O2 titrated back to 5L HFNC.  0000: Pt resting comfortably.  0400: Pt continues resting comfortably.  0700: Handoff report given to RN. No acute events overnight.
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
## 2017-08-08 NOTE — Progress Notes (Signed)
Pt went the entire shift with no oxygen.   Did appear a little more anxious today than yesterday but he is "ready to go home".  Appetite still fair but he says he does not like the food.   Had two bowel movements.   Coughed up less sputum than yesterday.

## 2017-08-08 NOTE — Progress Notes (Signed)
Patient started the shift on 2 liters of 02.  RT came and gave patient his nebulizer treatment.  Upon reassessment Nurse started to trial patient off of 02.  Patient has so far been able to maintain his saturation above 92%.  HISTORY: Patient states that he smokes a pack of day at home.
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
## 2017-08-09 MED ORDER — OXYCODONE HCL 5 MG PO TABS: 5.0000 mg | ORAL_TABLET | Freq: Four times a day (QID) | ORAL | 0 refills | Status: AC | PRN

## 2017-08-09 MED ORDER — METHOCARBAMOL 500 MG PO TABS: 1000.0000 mg | ORAL_TABLET | Freq: Three times a day (TID) | ORAL | 0 refills | Status: DC
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
## 2017-08-09 NOTE — Progress Notes (Signed)
Discharge instructions reviewed with pt and spouse. Prescriptions faxed to pharmacy. Pt with oxygen and home belongings at time of discharge. [facility] to deliver walker and BSC to home. All questions answered.
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
## 2017-08-09 NOTE — Progress Notes (Signed)
Patient ambulated without O2 in the halls.  He desat to 78%.  On recovery without O2 he only came up to 86% after a significant period of rest.  2L O 2 was then replaced and the came up to the low 90s, 91-94%.  He will meet criteria for home O2 and his PCP is willing to follow him for this.  HH has been arranged and he is stable for DC home.  Letha CapeKelly E Decarla Siemen 9:52 AM 08/09/2017

## 2017-09-23 DIAGNOSIS — J189 Pneumonia, unspecified organism: Secondary | ICD-10-CM | POA: Insufficient documentation

## 2018-03-11 DIAGNOSIS — I495 Sick sinus syndrome: Secondary | ICD-10-CM | POA: Insufficient documentation

## 2018-03-17 DIAGNOSIS — R002 Palpitations: Secondary | ICD-10-CM | POA: Insufficient documentation

## 2019-05-03 DIAGNOSIS — I872 Venous insufficiency (chronic) (peripheral): Secondary | ICD-10-CM | POA: Insufficient documentation

## 2019-05-03 DIAGNOSIS — D6859 Other primary thrombophilia: Secondary | ICD-10-CM | POA: Insufficient documentation

## 2019-05-03 DIAGNOSIS — I825Y2 Chronic embolism and thrombosis of unspecified deep veins of left proximal lower extremity: Secondary | ICD-10-CM | POA: Insufficient documentation
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
## 2021-07-04 DIAGNOSIS — M2042 Other hammer toe(s) (acquired), left foot: Secondary | ICD-10-CM | POA: Insufficient documentation

## 2021-07-04 DIAGNOSIS — L97522 Non-pressure chronic ulcer of other part of left foot with fat layer exposed: Secondary | ICD-10-CM | POA: Insufficient documentation

## 2022-04-19 DIAGNOSIS — S2249XA Multiple fractures of ribs, unspecified side, initial encounter for closed fracture: Secondary | ICD-10-CM | POA: Insufficient documentation

## 2022-05-05 DIAGNOSIS — R1031 Right lower quadrant pain: Secondary | ICD-10-CM | POA: Insufficient documentation

## 2022-06-26 DIAGNOSIS — G629 Polyneuropathy, unspecified: Secondary | ICD-10-CM | POA: Insufficient documentation

## 2022-07-27 ENCOUNTER — Inpatient Hospital Stay: Admit: 2022-07-27 | Discharge: 2022-07-27

## 2022-07-27 DIAGNOSIS — Z5329 Procedure and treatment not carried out because of patient's decision for other reasons: Secondary | ICD-10-CM
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
## 2023-12-02 ENCOUNTER — Ambulatory Visit: Admitting: Podiatry

## 2023-12-02 ENCOUNTER — Encounter: Payer: Self-pay | Admitting: Podiatry

## 2023-12-02 DIAGNOSIS — M21611 Bunion of right foot: Secondary | ICD-10-CM | POA: Diagnosis not present

## 2023-12-02 DIAGNOSIS — M21612 Bunion of left foot: Secondary | ICD-10-CM | POA: Diagnosis not present

## 2023-12-02 DIAGNOSIS — G4762 Sleep related leg cramps: Secondary | ICD-10-CM

## 2023-12-02 DIAGNOSIS — G609 Hereditary and idiopathic neuropathy, unspecified: Secondary | ICD-10-CM | POA: Diagnosis not present

## 2023-12-02 DIAGNOSIS — G8929 Other chronic pain: Secondary | ICD-10-CM

## 2023-12-02 DIAGNOSIS — M5442 Lumbago with sciatica, left side: Secondary | ICD-10-CM

## 2023-12-02 DIAGNOSIS — M5441 Lumbago with sciatica, right side: Secondary | ICD-10-CM

## 2023-12-02 NOTE — Progress Notes (Signed)
 Chief Complaint  Patient presents with   Peripheral Neuropathy    I have that neuropathy running down my legs into my feet. My feet are just driving me crazy all day long. Describes pain as bad, pins and needles, shooting like you stuck your toe in an electric outlet. Ongoing for 4-5 months. Has tried everything and every kind of foot cream. Not diabetic. No anticoag.    Discussed the use of AI scribe software for clinical note transcription with the patient, who gave verbal consent to proceed.  History of Present Illness Brad Allen is a 63 year old male who presents with severe foot pain. He is managed by Dr. Gerard, a pain specialist.  He has been experiencing severe neuropathy in his feet for the past three to four months. The pain is described as 'electricity' and 'burning,' with sensations of vibration, similar to a 'vibrator thing.' The pain has progressively worsened, significantly impacting his daily life.  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 An MRI conducted 10 to 15 years ago revealed nerve involvement around the spine, but no surgical intervention was pursued due to the risk of paralysis. He has not had any recent imaging studies or procedures for his back.  He is currently prescribed oxycodone  15 mg and Xanax  5 mg, though these medications do not alleviate his symptoms. He also takes gabapentin  but has not noticed any improvement. He has not undergone any nerve conduction studies or EMG tests.  He experiences cramps in his ankles and toes, particularly at night, which require him to get out of bed to relieve the discomfort. No alcohol use. He reports decreased sensation in his feet and toes, with difficulty feeling temperature changes and vibrations.  He has tried various over-the-counter foot creams without relief and has not used any compounded creams for neuropathy.  Patient appears slightly lethargic today  with slurred speech.  He does currently see pain management but states that they have never evaluated his back or performed an EMG.  He is currently on 15 mg of oxycodone  and states that it does nothing    Past Medical History:  Diagnosis Date   Anxiety    Arthritis    COPD (chronic obstructive pulmonary disease) (HCC)    Depression    Seizures (HCC)    Past Surgical History:  Procedure Laterality Date   CARDIAC CATHETERIZATION     Allergies  Allergen Reactions   Lactose Intolerance (Gi) Nausea And Vomiting   Physical Exam CARDIOVASCULAR: Peripheral pulses intact. NEUROLOGICAL: Decreased protective sensation, temperature, and vibration sensation in feet. Motor strength intact in lower extremities. Orthopedic: There are bilateral bunions with hallux valgus bilateral.  These are moderate to severe in nature.  There are semirigid contractures of the lesser toes consistent with hammertoe deformities.  There are some corns at the distal tips of the toes.  The current shoes are worn down. Dermatologic: There is hemosiderin deposition to both legs and feet.  There is some maceration in the left fourth interspace.   Results RADIOLOGY No previous spinal x-ray or MRI reports are within the [facility] for review    Assessment/Plan of Care: 1. Idiopathic peripheral neuropathy   2. Nocturnal leg cramps   3. Bunion, left foot   4. Bunion, right foot   5. Chronic bilateral low back pain with bilateral sciatica     Assessment & Plan Peripheral neuropathy of bilateral feet and lower  legs Chronic peripheral neuropathy in bilateral feet and lower legs for 3-4 months, characterized by shooting, burning, and vibrating sensations. Previous MRI (according to patient) indicated a ruptured disc at L4 with nerve involvement. Current medications include oxycodone , Xanax , and gabapentin , with limited relief. No prior EMG or nerve conduction study performed. Differential includes neuropathy  secondary to lumbar radiculopathy. - Order EMG/NCV to assess nerve function and determine if neuropathy is related to lumbar radiculopathy.  Will place referral to neurology/spinal orthopedics to have this performed and have evaluation by a spinal surgeon or neurology specialist - Prescribed compounded topical cream for neuropathy, to be compounded by a specialized pharmacy and mailed to him. - Coordinated with neurology for EMG scheduling and prior authorization.  Castellani's paint was applied to the interspaces to help dry them out.  Chronic low back pain with possible radiculopathy Chronic low back pain with possible radiculopathy, likely related to a ruptured disc at L4. Symptoms include cramps in ankles and toes, and muscle soreness in the back of the legs. No surgical intervention due to high risk of complications. Previous physical therapy was ineffective. Symptoms may be exacerbated by certain positions, suggesting nerve involvement. - Discussed potential referral to neurosurgery if EMG indicates radiculopathy as the cause of neuropathy. - Advised on positioning and leg elevation to alleviate symptoms.     Awanda CHARM Imperial, DPM, FACFAS [facility]     [address] [place], [place] [address]                Office [phone]  Fax [phone]

## 2023-12-06 ENCOUNTER — Telehealth: Payer: Self-pay | Admitting: Lab

## 2023-12-06 NOTE — Telephone Encounter (Signed)
 Patient left message stating the provider was suppose to order him some medication and mailed to him but has not heard or received anything can you review and advise.

## 2023-12-07 NOTE — Telephone Encounter (Signed)
 Tried to contact patient, both numbers listed are non working numbers. I spoke with [facility] and they do have his prescription and have tried to reach him as well. I will send a mychart message with their number for him as well CT

## 2023-12-22 NOTE — Telephone Encounter (Signed)
 Patient called the nurse line and left a message - asking about a prescription. He left a different number  [phone]
